# Patient Record
Sex: Female | Born: 1970 | Race: White | Hispanic: No | Marital: Single | Smoking: Never smoker
Health system: Southern US, Community
[De-identification: ages and names within clinical notes are randomized; demographics above are authoritative.]

## PROBLEM LIST (undated history)

## (undated) DIAGNOSIS — F419 Anxiety disorder, unspecified: Secondary | ICD-10-CM

## (undated) HISTORY — DX: Anxiety disorder, unspecified: F41.9

---

## 1993-04-24 HISTORY — PX: PELVIC LAPAROSCOPY: SHX162

## 2001-04-24 HISTORY — PX: TUBAL LIGATION: SHX77

## 2010-09-30 ENCOUNTER — Ambulatory Visit: Payer: Self-pay | Admitting: Gynecology

## 2010-10-11 ENCOUNTER — Ambulatory Visit: Payer: Self-pay | Admitting: Gynecology

## 2010-10-17 ENCOUNTER — Other Ambulatory Visit: Payer: Self-pay | Admitting: Gynecology

## 2010-10-17 ENCOUNTER — Ambulatory Visit (INDEPENDENT_AMBULATORY_CARE_PROVIDER_SITE_OTHER): Payer: BLUE CROSS/BLUE SHIELD | Admitting: Gynecology

## 2010-10-17 ENCOUNTER — Other Ambulatory Visit (HOSPITAL_COMMUNITY)
Admission: RE | Admit: 2010-10-17 | Discharge: 2010-10-17 | Disposition: A | Discharge status: Home / Self Care | Payer: BLUE CROSS/BLUE SHIELD | Source: Ambulatory Visit | Attending: Gynecology | Admitting: Gynecology

## 2010-10-17 DIAGNOSIS — Z124 Encounter for screening for malignant neoplasm of cervix: Secondary | ICD-10-CM | POA: Insufficient documentation

## 2010-10-17 DIAGNOSIS — Z01419 Encounter for gynecological examination (general) (routine) without abnormal findings: Secondary | ICD-10-CM

## 2010-10-17 DIAGNOSIS — N6009 Solitary cyst of unspecified breast: Secondary | ICD-10-CM

## 2010-10-17 DIAGNOSIS — Z833 Family history of diabetes mellitus: Secondary | ICD-10-CM

## 2010-10-17 DIAGNOSIS — Z13220 Encounter for screening for lipoid disorders: Secondary | ICD-10-CM

## 2010-10-17 DIAGNOSIS — R82998 Other abnormal findings in urine: Secondary | ICD-10-CM

## 2010-10-17 DIAGNOSIS — N920 Excessive and frequent menstruation with regular cycle: Secondary | ICD-10-CM

## 2010-10-28 ENCOUNTER — Other Ambulatory Visit: Payer: BLUE CROSS/BLUE SHIELD

## 2010-10-28 ENCOUNTER — Ambulatory Visit: Payer: BLUE CROSS/BLUE SHIELD | Admitting: Gynecology

## 2012-04-03 ENCOUNTER — Telehealth: Payer: Self-pay | Admitting: *Deleted

## 2012-04-03 NOTE — Telephone Encounter (Signed)
Pt informed with the below note and order will be faxed to [facility] office # [phone]

## 2012-04-03 NOTE — Telephone Encounter (Signed)
Pt has annual schedule on 04/08/12, she had her mammogram done at a mobile unit on 02/29/12 and they request additional views with possible ultrasound. (result placed on your desk)  Pt asked if you be willing to write order for this? Or is better for her to have OV to see you first? Please advise

## 2012-04-03 NOTE — Telephone Encounter (Signed)
I would go ahead and have the additional views as they recommended

## 2012-04-08 ENCOUNTER — Encounter: Payer: BC Managed Care – PPO | Admitting: Gynecology

## 2012-04-18 ENCOUNTER — Encounter: Payer: Self-pay | Admitting: Gynecology

## 2012-04-22 ENCOUNTER — Encounter: Payer: BC Managed Care – PPO | Admitting: Gynecology

## 2012-04-29 ENCOUNTER — Encounter: Payer: Self-pay | Admitting: Gynecology

## 2012-05-10 ENCOUNTER — Encounter: Payer: BC Managed Care – PPO | Admitting: Gynecology

## 2012-06-06 ENCOUNTER — Encounter: Payer: BC Managed Care – PPO | Admitting: Gynecology

## 2014-02-23 ENCOUNTER — Encounter: Payer: Self-pay | Admitting: Gynecology

## 2014-05-01 ENCOUNTER — Encounter: Payer: Self-pay | Admitting: Gynecology

## 2014-05-01 ENCOUNTER — Other Ambulatory Visit (HOSPITAL_COMMUNITY)
Admission: RE | Admit: 2014-05-01 | Discharge: 2014-05-01 | Disposition: A | Discharge status: Home / Self Care | Payer: 59 | Source: Ambulatory Visit | Attending: Gynecology | Admitting: Gynecology

## 2014-05-01 ENCOUNTER — Ambulatory Visit (INDEPENDENT_AMBULATORY_CARE_PROVIDER_SITE_OTHER): Payer: 59 | Admitting: Gynecology

## 2014-05-01 VITALS — BP 112/74 | Ht 68.0 in | Wt 159.0 lb

## 2014-05-01 DIAGNOSIS — Z01419 Encounter for gynecological examination (general) (routine) without abnormal findings: Secondary | ICD-10-CM | POA: Diagnosis present

## 2014-05-01 DIAGNOSIS — Z1151 Encounter for screening for human papillomavirus (HPV): Secondary | ICD-10-CM | POA: Insufficient documentation

## 2014-05-01 DIAGNOSIS — F4322 Adjustment disorder with anxiety: Secondary | ICD-10-CM

## 2014-05-01 DIAGNOSIS — N6019 Diffuse cystic mastopathy of unspecified breast: Secondary | ICD-10-CM

## 2014-05-01 LAB — COMPREHENSIVE METABOLIC PANEL
ALK PHOS: 84 U/L (ref 39–117)
ALT: 11 U/L (ref 0–35)
AST: 11 U/L (ref 0–37)
Albumin: 4.1 g/dL (ref 3.5–5.2)
BUN: 9 mg/dL (ref 6–23)
CALCIUM: 9.1 mg/dL (ref 8.4–10.5)
CO2: 25 meq/L (ref 19–32)
Chloride: 105 mEq/L (ref 96–112)
Creat: 0.78 mg/dL (ref 0.50–1.10)
Glucose, Bld: 90 mg/dL (ref 70–99)
POTASSIUM: 3.9 meq/L (ref 3.5–5.3)
SODIUM: 141 meq/L (ref 135–145)
TOTAL PROTEIN: 6.9 g/dL (ref 6.0–8.3)
Total Bilirubin: 0.3 mg/dL (ref 0.2–1.2)

## 2014-05-01 LAB — CBC WITH DIFFERENTIAL/PLATELET
Basophils Absolute: 0 10*3/uL (ref 0.0–0.1)
Basophils Relative: 0 % (ref 0–1)
EOS PCT: 1 % (ref 0–5)
Eosinophils Absolute: 0.1 10*3/uL (ref 0.0–0.7)
HEMATOCRIT: 32 % — AB (ref 36.0–46.0)
HEMOGLOBIN: 10.3 g/dL — AB (ref 12.0–15.0)
LYMPHS PCT: 29 % (ref 12–46)
Lymphs Abs: 1.8 10*3/uL (ref 0.7–4.0)
MCH: 25.3 pg — AB (ref 26.0–34.0)
MCHC: 32.2 g/dL (ref 30.0–36.0)
MCV: 78.6 fL (ref 78.0–100.0)
MONO ABS: 0.6 10*3/uL (ref 0.1–1.0)
MONOS PCT: 10 % (ref 3–12)
MPV: 10.8 fL (ref 8.6–12.4)
NEUTROS ABS: 3.8 10*3/uL (ref 1.7–7.7)
Neutrophils Relative %: 60 % (ref 43–77)
PLATELETS: 249 10*3/uL (ref 150–400)
RBC: 4.07 MIL/uL (ref 3.87–5.11)
RDW: 14.4 % (ref 11.5–15.5)
WBC: 6.3 10*3/uL (ref 4.0–10.5)

## 2014-05-01 LAB — LIPID PANEL
CHOL/HDL RATIO: 3 ratio
Cholesterol: 141 mg/dL (ref 0–200)
HDL: 47 mg/dL (ref >39)
LDL CALC: 76 mg/dL (ref 0–99)
TRIGLYCERIDES: 91 mg/dL (ref <150)
VLDL: 18 mg/dL (ref 0–40)

## 2014-05-01 LAB — TSH: TSH: 1.44 u[IU]/mL (ref 0.350–4.500)

## 2014-05-01 MED ORDER — FLUOXETINE HCL 20 MG PO CAPS: 20.0000 mg | ORAL_CAPSULE | Freq: Every day | ORAL | Status: DC

## 2014-05-01 NOTE — Progress Notes (Addendum)
Erika MedinMelissa A York 1971-04-22 [id number]        44 y.o.  [id number] for annual exam.  Has not been in the office for over 3 years. Several issues noted below.  Past medical history,surgical history, problem list, medications, allergies, family history and social history were all reviewed and documented as reviewed in the EPIC chart.  ROS:  Performed with pertinent positives and negatives included in the history, assessment and plan.   Additional significant findings :  none   Exam: [facility] personassistant Filed Vitals:   05/01/14 1112  BP: 112/74  Height: 5\' 8"  (1.727 m)  Weight: 159 lb (72.122 kg)   General appearance:  Normal affect, orientation and appearance. Skin: Grossly normal HEENT: Without gross lesions.  No cervical or supraclavicular adenopathy. Thyroid normal.  Lungs:  Clear without wheezing, rales or rhonchi Cardiac: RR, without RMG Abdominal:  Soft, nontender, without masses, guarding, rebound, organomegaly or hernia Breasts:  Examined lying and sitting without masses, retractions, discharge or axillary adenopathy.  Dense breast bilaterally right greater than left. No definitive masses felt. Pelvic:  Ext/BUS/vagina normal with menses type flow  Cervix normal with menses flow. Pap/HPV  Uterus anteverted, normal size, shape and contour, midline and mobile nontender   Adnexa  Without masses or tenderness    Anus and perineum  Normal   Rectovaginal  Normal sphincter tone without palpated masses or tenderness.    Assessment/Plan:  44 y.o. [id number] female for annual exam with regular menses, tubal sterilization.   1. Bilateral breast tenderness right greater than left. Exam shows very dense breasts right greater than left. History of cyst aspiration in the past but I feel no definitive masses on exam nor does patient on self exam. Recommend bilateral diagnostic mammography with ultrasound as a screening procedure. Office will call and helped arrange this. Assuming negative them plan  expected management with self breast exams monthly and reporting any palpable abnormalities. 2. Anxiety. Patients noting worsening anxiety due to situation with her children and taking care of aging parents. Having difficulty falling asleep because her mind is racing. No suicide ideation or significant depressive symptoms. Options for management reviewed and ultimately we decided on fluoxetine 20 mg daily #30 with 11 refills. Side effect profile reviewed to include suicidal ideation. Patient will call if any issues. Discussed possible dosage adjustment or switching to a different medication if needed. If she does well once starting then we'll continue for this year.  Check baseline TSH. 3. Pap smear 2012. Pap/HPV today. No history of significant abnormal Pap smears previously. 4. Health maintenance. Baseline CBC, comprehensive metabolic panel lipid profile done along with TSH due to her anxiety complaints. No urinalysis done due to her menses. Follow up for breast studies as above otherwise annually.     Dara LordsFONTAINE,Erika Varin P MD, 11:49 AM 05/01/2014

## 2014-05-01 NOTE — Patient Instructions (Signed)
Start on the fluoxetine 20 mg daily. Call me if you have any issues with this particularly if you're feeling worse and more depressed.  Office will call you to arrange the mammogram and ultrasound.  You may obtain a copy of any labs that were done today by logging onto MyChart as outlined in the instructions provided with your AVS (after visit summary). The office will not call with normal lab results but certainly if there are any significant abnormalities then we will contact you.   Health Maintenance, Female A healthy lifestyle and preventative care can promote health and wellness.  Maintain regular health, dental, and eye exams.  Eat a healthy diet. Foods like vegetables, fruits, whole grains, low-fat dairy products, and lean protein foods contain the nutrients you need without too many calories. Decrease your intake of foods high in solid fats, added sugars, and salt. Get information about a proper diet from your caregiver, if necessary.  Regular physical exercise is one of the most important things you can do for your health. Most adults should get at least 150 minutes of moderate-intensity exercise (any activity that increases your heart rate and causes you to sweat) each week. In addition, most adults need muscle-strengthening exercises on 2 or more days a week.   Maintain a healthy weight. The body mass index (BMI) is a screening tool to identify possible weight problems. It provides an estimate of body fat based on height and weight. Your caregiver can help determine your BMI, and can help you achieve or maintain a healthy weight. For adults 20 years and older:  A BMI below 18.5 is considered underweight.  A BMI of 18.5 to 24.9 is normal.  A BMI of 25 to 29.9 is considered overweight.  A BMI of 30 and above is considered obese.  Maintain normal blood lipids and cholesterol by exercising and minimizing your intake of saturated fat. Eat a balanced diet with plenty of fruits and  vegetables. Blood tests for lipids and cholesterol should begin at age 22 and be repeated every 5 years. If your lipid or cholesterol levels are high, you are over 50, or you are a high risk for heart disease, you may need your cholesterol levels checked more frequently.Ongoing high lipid and cholesterol levels should be treated with medicines if diet and exercise are not effective.  If you smoke, find out from your caregiver how to quit. If you do not use tobacco, do not start.  Lung cancer screening is recommended for adults aged 46 80 years who are at high risk for developing lung cancer because of a history of smoking. Yearly low-dose computed tomography (CT) is recommended for people who have at least a 30-pack-year history of smoking and are a current smoker or have quit within the past 15 years. A pack year of smoking is smoking an average of 1 pack of cigarettes a day for 1 year (for example: 1 pack a day for 30 years or 2 packs a day for 15 years). Yearly screening should continue until the smoker has stopped smoking for at least 15 years. Yearly screening should also be stopped for people who develop a health problem that would prevent them from having lung cancer treatment.  If you are pregnant, do not drink alcohol. If you are breastfeeding, be very cautious about drinking alcohol. If you are not pregnant and choose to drink alcohol, do not exceed 1 drink per day. One drink is considered to be 12 ounces (355 mL) of beer,  5 ounces (148 mL) of wine, or 1.5 ounces (44 mL) of liquor.  Avoid use of street drugs. Do not share needles with anyone. Ask for help if you need support or instructions about stopping the use of drugs.  High blood pressure causes heart disease and increases the risk of stroke. Blood pressure should be checked at least every 1 to 2 years. Ongoing high blood pressure should be treated with medicines, if weight loss and exercise are not effective.  If you are 39 to 43 years  old, ask your caregiver if you should take aspirin to prevent strokes.  Diabetes screening involves taking a blood sample to check your fasting blood sugar level. This should be done once every 3 years, after age 47, if you are within normal weight and without risk factors for diabetes. Testing should be considered at a younger age or be carried out more frequently if you are overweight and have at least 1 risk factor for diabetes.  Breast cancer screening is essential preventative care for women. You should practice "breast self-awareness." This means understanding the normal appearance and feel of your breasts and may include breast self-examination. Any changes detected, no matter how small, should be reported to a caregiver. Women in their 61s and 30s should have a clinical breast exam (CBE) by a caregiver as part of a regular health exam every 1 to 3 years. After age 58, women should have a CBE every year. Starting at age 88, women should consider having a mammogram (breast X-ray) every year. Women who have a family history of breast cancer should talk to their caregiver about genetic screening. Women at a high risk of breast cancer should talk to their caregiver about having an MRI and a mammogram every year.  Breast cancer gene (BRCA)-related cancer risk assessment is recommended for women who have family members with BRCA-related cancers. BRCA-related cancers include breast, ovarian, tubal, and peritoneal cancers. Having family members with these cancers may be associated with an increased risk for harmful changes (mutations) in the breast cancer genes BRCA1 and BRCA2. Results of the assessment will determine the need for genetic counseling and BRCA1 and BRCA2 testing.  The Pap test is a screening test for cervical cancer. Women should have a Pap test starting at age 15. Between ages 40 and 48, Pap tests should be repeated every 2 years. Beginning at age 67, you should have a Pap test every 3 years  as long as the past 3 Pap tests have been normal. If you had a hysterectomy for a problem that was not cancer or a condition that could lead to cancer, then you no longer need Pap tests. If you are between ages 66 and 47, and you have had normal Pap tests going back 10 years, you no longer need Pap tests. If you have had past treatment for cervical cancer or a condition that could lead to cancer, you need Pap tests and screening for cancer for at least 20 years after your treatment. If Pap tests have been discontinued, risk factors (such as a new sexual partner) need to be reassessed to determine if screening should be resumed. Some women have medical problems that increase the chance of getting cervical cancer. In these cases, your caregiver may recommend more frequent screening and Pap tests.  The human papillomavirus (HPV) test is an additional test that may be used for cervical cancer screening. The HPV test looks for the virus that can cause the cell changes on the  cervix. The cells collected during the Pap test can be tested for HPV. The HPV test could be used to screen women aged 36 years and older, and should be used in women of any age who have unclear Pap test results. After the age of 94, women should have HPV testing at the same frequency as a Pap test.  Colorectal cancer can be detected and often prevented. Most routine colorectal cancer screening begins at the age of 52 and continues through age 61. However, your caregiver may recommend screening at an earlier age if you have risk factors for colon cancer. On a yearly basis, your caregiver may provide home test kits to check for hidden blood in the stool. Use of a small camera at the end of a tube, to directly examine the colon (sigmoidoscopy or colonoscopy), can detect the earliest forms of colorectal cancer. Talk to your caregiver about this at age 21, when routine screening begins. Direct examination of the colon should be repeated every 5 to 10  years through age 30, unless early forms of pre-cancerous polyps or small growths are found.  Hepatitis C blood testing is recommended for all people born from [birth year] through 1964 and any individual with known risks for hepatitis C.  Practice safe sex. Use condoms and avoid high-risk sexual practices to reduce the spread of sexually transmitted infections (STIs). Sexually active women aged 67 and younger should be checked for Chlamydia, which is a common sexually transmitted infection. Older women with new or multiple partners should also be tested for Chlamydia. Testing for other STIs is recommended if you are sexually active and at increased risk.  Osteoporosis is a disease in which the bones lose minerals and strength with aging. This can result in serious bone fractures. The risk of osteoporosis can be identified using a bone density scan. Women ages 20 and over and women at risk for fractures or osteoporosis should discuss screening with their caregivers. Ask your caregiver whether you should be taking a calcium supplement or vitamin D to reduce the rate of osteoporosis.  Menopause can be associated with physical symptoms and risks. Hormone replacement therapy is available to decrease symptoms and risks. You should talk to your caregiver about whether hormone replacement therapy is right for you.  Use sunscreen. Apply sunscreen liberally and repeatedly throughout the day. You should seek shade when your shadow is shorter than you. Protect yourself by wearing long sleeves, pants, a wide-brimmed hat, and sunglasses year round, whenever you are outdoors.  Notify your caregiver of new moles or changes in moles, especially if there is a change in shape or color. Also notify your caregiver if a mole is larger than the size of a pencil eraser.  Stay current with your immunizations. Document Released: 10/24/2010 Document Revised: 08/05/2012 Document Reviewed: 10/24/2010 [facility] Patient Information 2014  [place].

## 2014-05-01 NOTE — Addendum Note (Signed)
Addended by: Dayna BarkerGARDNER, Damean Poffenberger K on: 05/01/2014 01:10 PM   Modules accepted: Orders, SmartSet

## 2014-05-04 ENCOUNTER — Other Ambulatory Visit: Payer: Self-pay | Admitting: Gynecology

## 2014-05-04 DIAGNOSIS — D649 Anemia, unspecified: Secondary | ICD-10-CM

## 2014-05-05 LAB — CYTOLOGY - PAP

## 2014-05-14 ENCOUNTER — Telehealth: Payer: Self-pay | Admitting: *Deleted

## 2014-05-14 NOTE — Telephone Encounter (Signed)
Pt appointment per note on 05/01/14 "Recommend bilateral diagnostic mammography with ultrasound as a screening procedure" scheduled on 05/25/14 @ 2:30pm order signed and faxed.

## 2014-06-02 ENCOUNTER — Encounter: Payer: Self-pay | Admitting: Gynecology

## 2014-06-22 ENCOUNTER — Other Ambulatory Visit: Payer: Self-pay

## 2014-06-22 MED ORDER — FLUOXETINE HCL 20 MG PO CAPS: 20.0000 mg | ORAL_CAPSULE | Freq: Every day | ORAL | Status: DC

## 2014-06-24 ENCOUNTER — Other Ambulatory Visit: Payer: Self-pay

## 2014-06-24 MED ORDER — FLUOXETINE HCL 20 MG PO CAPS: 20.0000 mg | ORAL_CAPSULE | Freq: Every day | ORAL | Status: AC

## 2015-03-23 ENCOUNTER — Other Ambulatory Visit: Payer: Self-pay | Admitting: Family Medicine

## 2015-03-23 DIAGNOSIS — R197 Diarrhea, unspecified: Secondary | ICD-10-CM

## 2015-03-23 DIAGNOSIS — R112 Nausea with vomiting, unspecified: Secondary | ICD-10-CM

## 2015-03-23 DIAGNOSIS — R1084 Generalized abdominal pain: Secondary | ICD-10-CM

## 2015-03-29 ENCOUNTER — Ambulatory Visit
Admission: RE | Admit: 2015-03-29 | Discharge: 2015-03-29 | Disposition: A | Discharge status: Home / Self Care | Payer: 59 | Source: Ambulatory Visit | Attending: Family Medicine | Admitting: Family Medicine

## 2015-03-29 DIAGNOSIS — R112 Nausea with vomiting, unspecified: Secondary | ICD-10-CM

## 2015-03-29 DIAGNOSIS — R197 Diarrhea, unspecified: Secondary | ICD-10-CM

## 2015-03-29 DIAGNOSIS — R1084 Generalized abdominal pain: Secondary | ICD-10-CM

## 2015-05-12 ENCOUNTER — Encounter: Payer: 59 | Admitting: Gynecology

## 2015-08-11 ENCOUNTER — Other Ambulatory Visit: Payer: Self-pay | Admitting: Gynecology

## 2016-06-28 IMAGING — US US ABDOMEN COMPLETE
1 series · 14 of 25 positions shown · non-contrast
Comparison: None.

CLINICAL DATA: Abdominal pain with nausea, vomiting, diarrhea

EXAM:
ULTRASOUND ABDOMEN COMPLETE

[Series 1: us abdomen complete · 0.28mm/px · 14 of 74 slices shown]
[im 1/74]
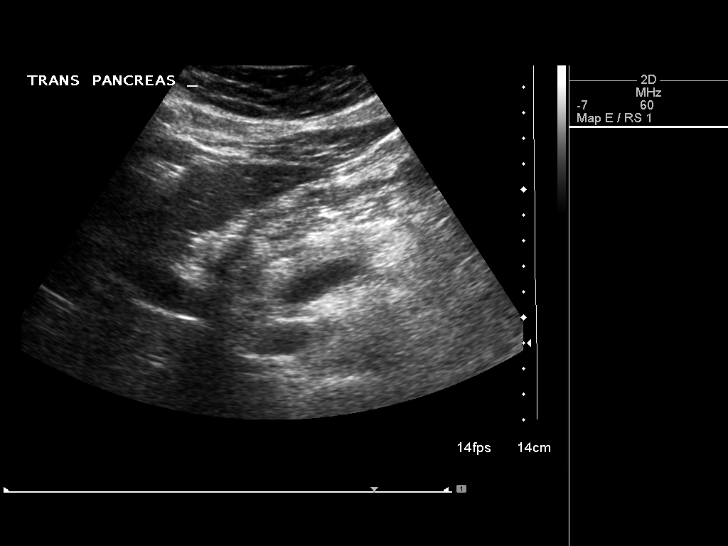
[im 7/74]
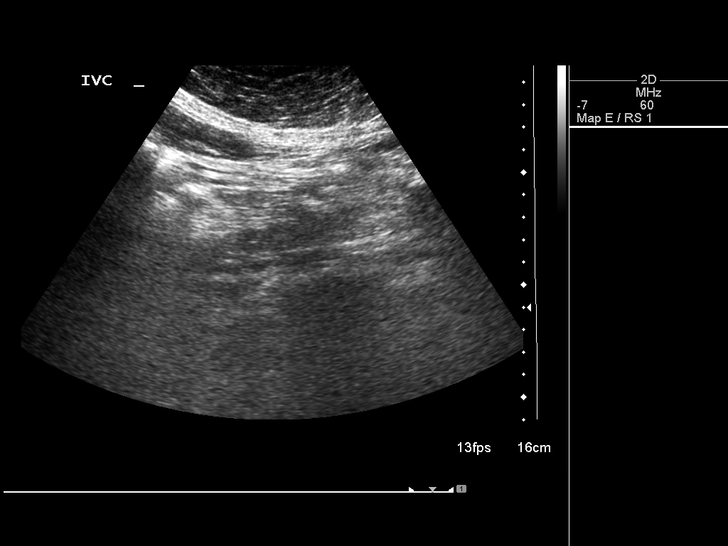
[im 13/74]
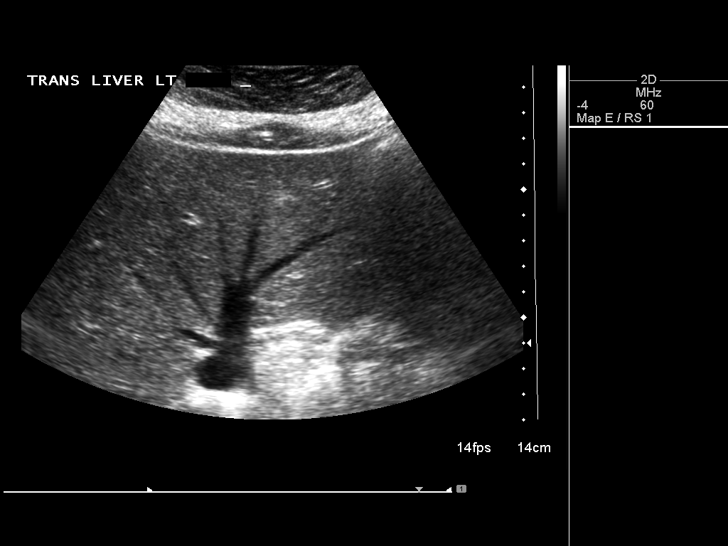
[im 19/74]
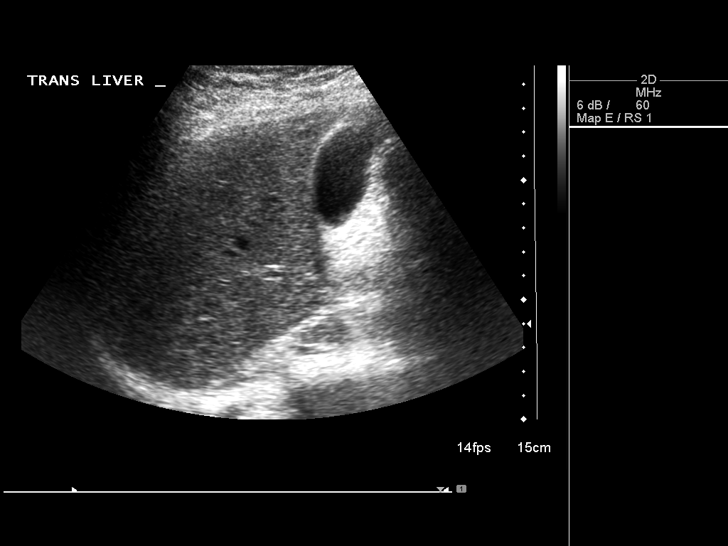
[im 25/74]
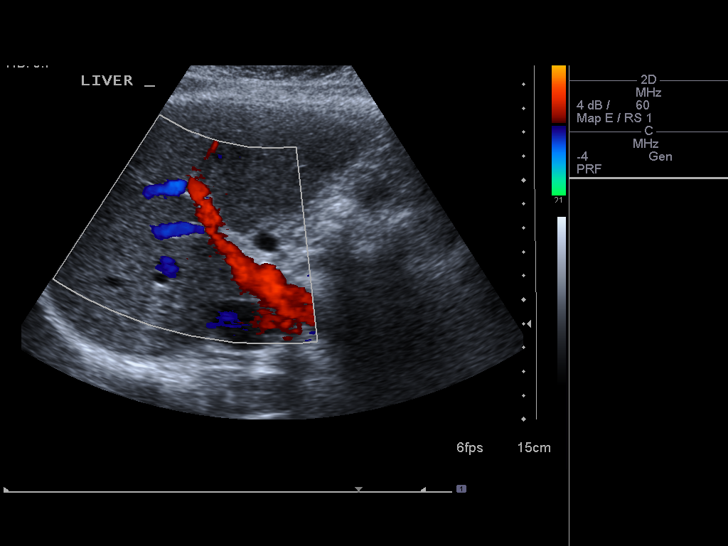
[im 28/74]
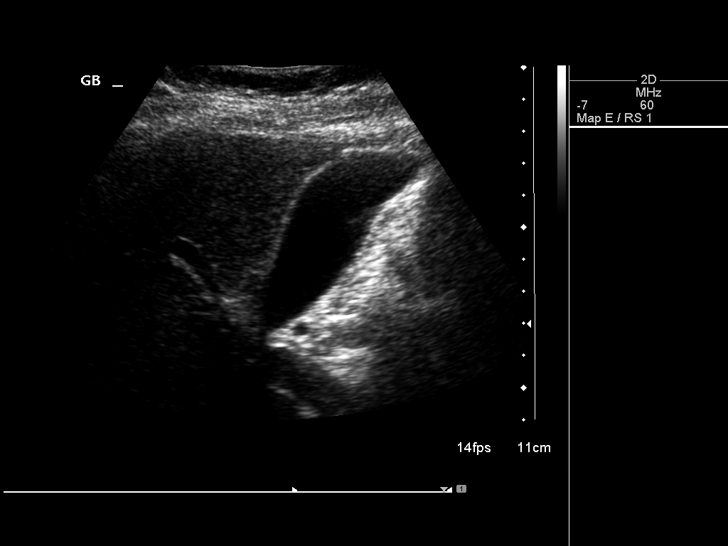
[im 34/74]
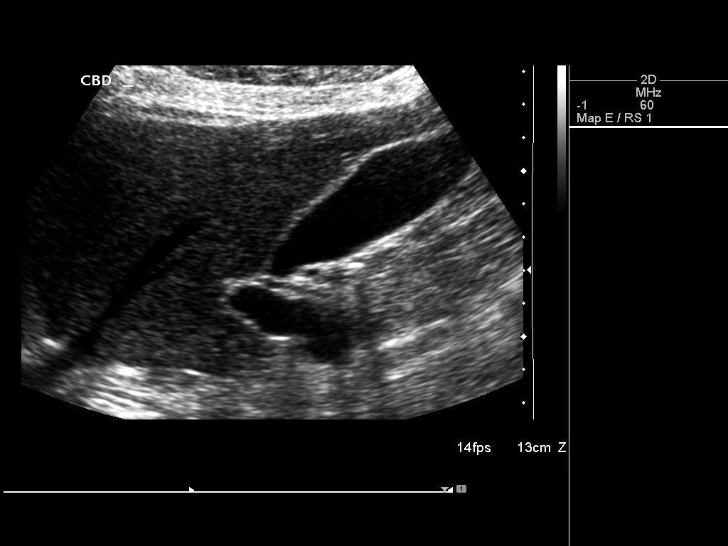
[im 40/74]
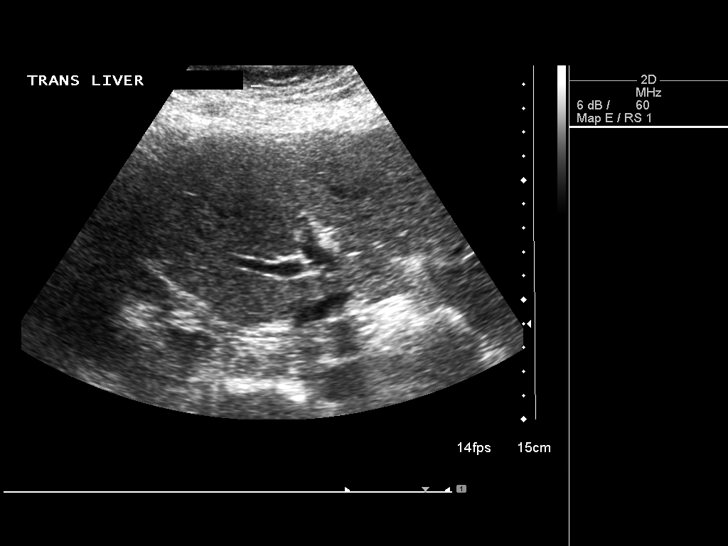
[im 46/74]
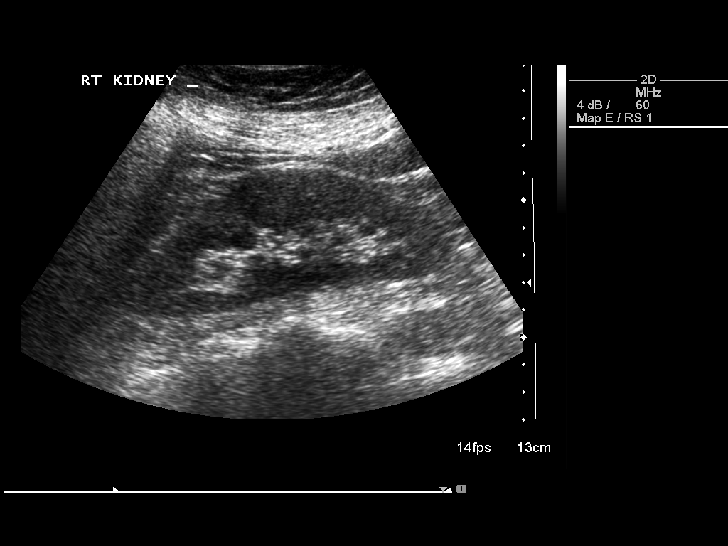
[im 49/74]
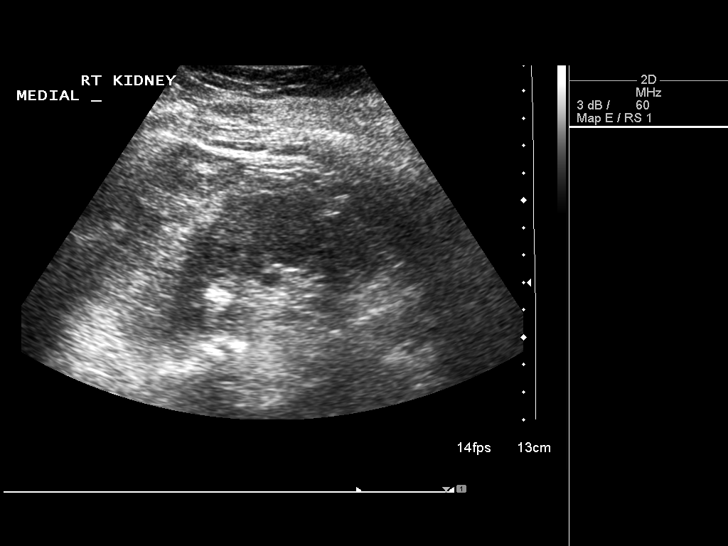
[im 55/74]
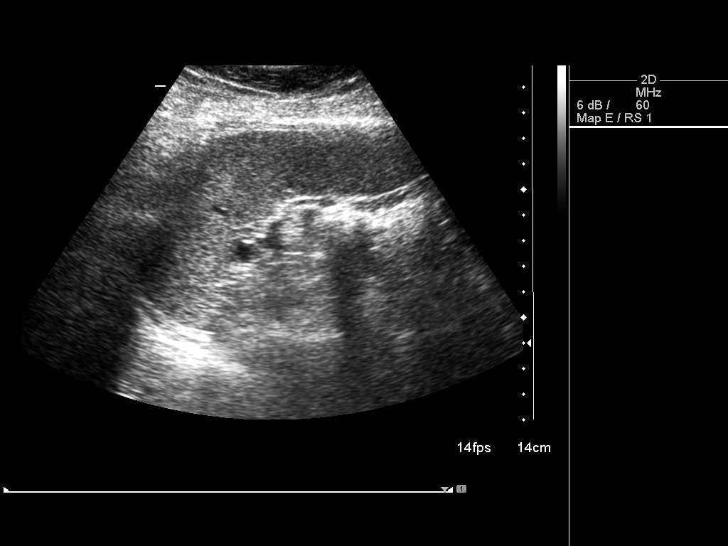
[im 61/74]
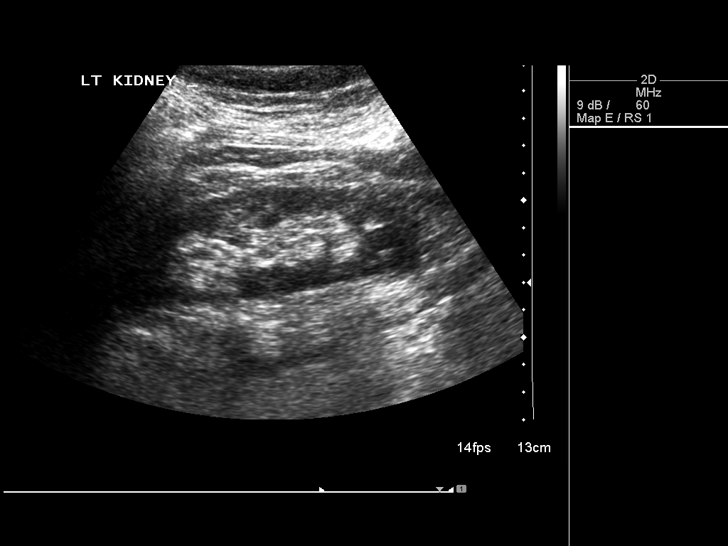
[im 67/74]
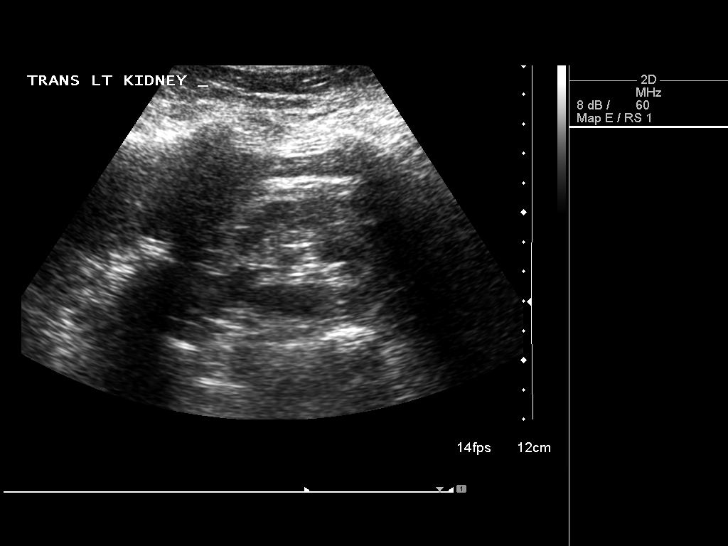
[im 74/74]
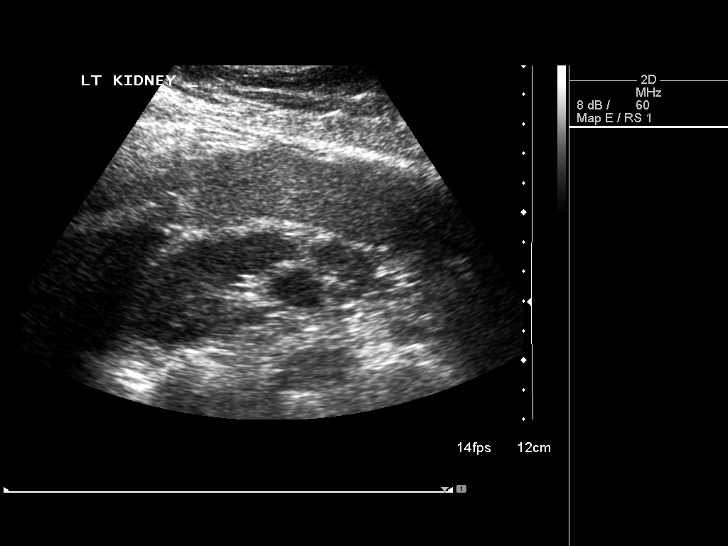

[14 of 25 positions shown; findings below may reference images not displayed]

FINDINGS: Gallbladder: No gallstones or wall thickening visualized. No
sonographic Murphy sign noted.

Common bile duct: Diameter: 3 mm. Where visualized, no filling
defect.

Liver: No focal lesion identified. Within normal limits in
parenchymal echogenicity. Antegrade flow in the imaged portal venous
system.

IVC: No abnormality visualized.

Pancreas: Typical incomplete visualization. Visualized portion
unremarkable.

Spleen: Size and appearance within normal limits.

Right Kidney: Length: 12 cm. Echogenicity within normal limits. No
mass or hydronephrosis visualized.

Left Kidney: Length: 12 cm. Echogenicity within normal limits. No
mass or hydronephrosis visualized.

Abdominal aorta: No aneurysm visualized.
IMPRESSION: Normal abdominal ultrasound.

## 2019-01-14 ENCOUNTER — Encounter: Payer: Self-pay | Admitting: Gynecology
